# Patient Record
Sex: Male | Born: 1980 | Race: White | Hispanic: No | Smoking: Current every day smoker
Health system: Southern US, Community
[De-identification: ages and names within clinical notes are randomized; demographics above are authoritative.]

## PROBLEM LIST (undated history)

## (undated) DIAGNOSIS — E782 Mixed hyperlipidemia: Secondary | ICD-10-CM

## (undated) DIAGNOSIS — I10 Essential (primary) hypertension: Secondary | ICD-10-CM

## (undated) DIAGNOSIS — F419 Anxiety disorder, unspecified: Secondary | ICD-10-CM

---

## 2006-12-06 ENCOUNTER — Ambulatory Visit: Payer: Self-pay | Admitting: Obstetrics & Gynecology

## 2015-01-18 ENCOUNTER — Emergency Department: Payer: 59

## 2015-01-18 ENCOUNTER — Emergency Department
Admission: EM | Admit: 2015-01-18 | Discharge: 2015-01-18 | Disposition: A | Discharge status: Home / Self Care | Payer: 59 | Attending: Emergency Medicine | Admitting: Emergency Medicine

## 2015-01-18 ENCOUNTER — Encounter: Payer: Self-pay | Admitting: Emergency Medicine

## 2015-01-18 ENCOUNTER — Other Ambulatory Visit: Payer: Self-pay

## 2015-01-18 DIAGNOSIS — I10 Essential (primary) hypertension: Secondary | ICD-10-CM | POA: Diagnosis not present

## 2015-01-18 DIAGNOSIS — R079 Chest pain, unspecified: Secondary | ICD-10-CM | POA: Insufficient documentation

## 2015-01-18 DIAGNOSIS — Z720 Tobacco use: Secondary | ICD-10-CM | POA: Insufficient documentation

## 2015-01-18 DIAGNOSIS — R61 Generalized hyperhidrosis: Secondary | ICD-10-CM | POA: Diagnosis not present

## 2015-01-18 DIAGNOSIS — J069 Acute upper respiratory infection, unspecified: Secondary | ICD-10-CM | POA: Insufficient documentation

## 2015-01-18 DIAGNOSIS — B9789 Other viral agents as the cause of diseases classified elsewhere: Secondary | ICD-10-CM

## 2015-01-18 HISTORY — DX: Mixed hyperlipidemia: E78.2

## 2015-01-18 HISTORY — DX: Essential (primary) hypertension: I10

## 2015-01-18 LAB — TROPONIN I
Troponin I: <0.03 ng/mL (ref <0.031)
Troponin I: <0.03 ng/mL (ref <0.031)

## 2015-01-18 LAB — COMPREHENSIVE METABOLIC PANEL
ALT: 25 U/L (ref 17–63)
AST: 24 U/L (ref 15–41)
Albumin: 4.6 g/dL (ref 3.5–5.0)
Alkaline Phosphatase: 66 U/L (ref 38–126)
Anion gap: 8 (ref 5–15)
BUN: 14 mg/dL (ref 6–20)
CHLORIDE: 104 mmol/L (ref 101–111)
CO2: 27 mmol/L (ref 22–32)
CREATININE: 1.07 mg/dL (ref 0.61–1.24)
Calcium: 9.1 mg/dL (ref 8.9–10.3)
GFR calc non Af Amer: >60 mL/min (ref >60)
GLUCOSE: 103 mg/dL — AB (ref 65–99)
Potassium: 4.4 mmol/L (ref 3.5–5.1)
SODIUM: 139 mmol/L (ref 135–145)
Total Bilirubin: 0.8 mg/dL (ref 0.3–1.2)
Total Protein: 7.5 g/dL (ref 6.5–8.1)

## 2015-01-18 LAB — CBC
HCT: 44.6 % (ref 40.0–52.0)
Hemoglobin: 15.7 g/dL (ref 13.0–18.0)
MCH: 31.8 pg (ref 26.0–34.0)
MCHC: 35.1 g/dL (ref 32.0–36.0)
MCV: 90.4 fL (ref 80.0–100.0)
PLATELETS: 298 10*3/uL (ref 150–440)
RBC: 4.94 MIL/uL (ref 4.40–5.90)
RDW: 12.1 % (ref 11.5–14.5)
WBC: 7.7 10*3/uL (ref 3.8–10.6)

## 2015-01-18 NOTE — ED Provider Notes (Signed)
[facility] Emergency Department Provider Note  ____________________________________________  Time seen: Approximately 2:40 PM  I have reviewed the triage vital signs and the nursing notes.   HISTORY  Chief Complaint Chest Pain    HPI Serina Cowperndrew Greenwalt is a 34 y.o. male with a history of hyperlipidemia and ongoing tobacco abuse presenting with chest pain. Patient reports that he has had "common cold" with associated congestion, rhinorrhea, intermittently productive cough. No fever, chills, shortness of breath. Over the past 3 days he has had a persistent central chest "pressure" that does not come and go, that does not radiate, and has no other associated symptoms. This morning the patient was in the shower when he developed a more severe version of the chest pressure which then radiated to bilateral arms and back and neck, associated with diaphoresis. No shortness of breath or nausea or vomiting. He called EMS. At this time, the patient is symptom-free. Patient does have a history of anxiety but feels that this episode was different than his previous panic attacks.   Past Medical History  Diagnosis Date  . Hypertension   . Elevated cholesterol with high triglycerides     There are no active problems to display for this patient.  Family history; no family history of early CAD.  Social history; ongoing tobacco abuse, several weekly drinks, denies any illicit drugs including cocaine.  History reviewed. No pertinent past surgical history.  No current outpatient prescriptions on file.  Allergies Review of patient's allergies indicates no known allergies.  No family history on file.  Social History Social History  Substance Use Topics  . Smoking status: Current Every Day Smoker  . Smokeless tobacco: None  . Alcohol Use: No    Review of Systems Constitutional: No fever/chills. Positive lightheadedness. Negative syncope. Positive diaphoresis Eyes: No  visual changes. ENT: No sore throat. Positive congestion Cardiovascular: Positive chest pain, negative palpitations. Respiratory: Denies shortness of breath.  Positive nonproductive cough. Gastrointestinal: No abdominal pain.  No nausea, no vomiting.  No diarrhea.  No constipation. Genitourinary: Negative for dysuria. Musculoskeletal: Negative for back pain. Skin: Negative for rash. Neurological: Negative for headaches, focal weakness or numbness.  10-point ROS otherwise negative.  ____________________________________________   PHYSICAL EXAM:  VITAL SIGNS: ED Triage Vitals  Enc Vitals Group     BP 01/18/15 1307 148/84 mmHg     Pulse Rate 01/18/15 1307 60     Resp 01/18/15 1307 18     Temp 01/18/15 1307 98.2 F (36.8 C)     Temp Source 01/18/15 1307 Oral     SpO2 01/18/15 1307 99 %     Weight 01/18/15 1307 229 lb (103.874 kg)     Height 01/18/15 1307 5\' 9"  (1.753 m)     Head Cir --      Peak Flow --      Pain Score 01/18/15 1308 5     Pain Loc --      Pain Edu? --      Excl. in GC? --     Constitutional: Alert and oriented. Well appearing and in no acute distress. Answer question appropriately. Eyes: Conjunctivae are normal.  EOMI. Head: Atraumatic. Nose: No congestion/rhinnorhea. Mouth/Throat: Mucous membranes are moist.  Neck: No stridor.  Supple.  No JVD Cardiovascular: Normal rate, regular rhythm. No murmurs, rubs or gallops.  Respiratory: Normal respiratory effort.  No retractions. Lungs CTAB.  No wheezes, rales or ronchi. Gastrointestinal: Overweight. Soft and nontender. No distention. No peritoneal signs. Musculoskeletal: No LE  edema. No palpable cords or calf tenderness. No Homans sign. Neurologic:  Normal speech and language. No gross focal neurologic deficits are appreciated.  Skin:  Skin is warm, dry and intact. No rash noted. Psychiatric: Mood and affect are normal. Speech and behavior are normal.  Normal  judgement.  ____________________________________________   LABS (all labs ordered are listed, but only abnormal results are displayed)  Labs Reviewed  COMPREHENSIVE METABOLIC PANEL - Abnormal; Notable for the following:    Glucose, Bld 103 (*)    All other components within normal limits  CBC  TROPONIN I  TROPONIN I   ____________________________________________  EKG  ED ECG REPORT I, Rockne Menghini, the attending physician, personally viewed and interpreted this ECG.  EKG shows a normal sinus rhythm with no ischemic changes. Patient has normal axis and normal intervals.  ____________________________________________  RADIOLOGY  Dg Chest 2 View  01/18/2015  CLINICAL DATA:  Chest pain and tightness in center of chest and a cough for 1.5 weeks, shielded EXAM: CHEST  2 VIEW COMPARISON:  None. FINDINGS: The heart size and mediastinal contours are within normal limits. Both lungs are clear. The visualized skeletal structures are unremarkable. IMPRESSION: No active cardiopulmonary disease. Electronically Signed   By: Norva Pavlov M.D.   On: 01/18/2015 15:00    ____________________________________________   PROCEDURES  Procedure(s) performed: None  Critical Care performed: No ____________________________________________   INITIAL IMPRESSION / ASSESSMENT AND PLAN / ED COURSE  Pertinent labs & imaging results that were available during my care of the patient were reviewed by me and considered in my medical decision making (see chart for details).  34 y.o. male with a history of hyperlipidemia and ongoing tobacco abuse presenting with a week of cough and cold symptoms, 3 days of constant chest pressure, and an episode of sharp chest pain earlier today. His EKG is reassuring, his labs show a negative troponin, and his chest x-ray is clear and without pneumonia. I have repeated his troponins twice that we are more than 4 hours from the onset of symptoms. I talked with  Dr. Welton Flakes and scheduled an outpatient stress test for the patient. I discussed return precautions and follow-up instructions with the patient and his wife.  ____________________________________________  FINAL CLINICAL IMPRESSION(S) / ED DIAGNOSES  Final diagnoses:  Diaphoresis  Chest pain, unspecified chest pain type  Viral URI with cough      NEW MEDICATIONS STARTED DURING THIS VISIT:  There are no discharge medications for this patient.    Rockne Menghini, MD 01/18/15 2137

## 2015-01-18 NOTE — Discharge Instructions (Signed)
Today, your workup in the emergency department for chest pain was very reassuring. However, given your known cardiac risk factors, it is imperative to have a risk stratification study (stress test) with the cardiologist.   Please continue to treat your cough and cold symptoms with over-the-counter medications as you have been doing.   Please return to the emergency department if you develop chest pain, shortness of breath, palpitations, fainting, fever, or any other symptoms concerning to you.

## 2015-01-18 NOTE — ED Notes (Signed)
C/o midsternal chest pressure that has been intermittent for the last 3 days, states today while he was in the shower he had chest pressure that was worse than before with some difficulty breathing, continues to have some pressure at present

## 2018-02-23 ENCOUNTER — Emergency Department: Payer: Self-pay

## 2018-02-23 ENCOUNTER — Emergency Department
Admission: EM | Admit: 2018-02-23 | Discharge: 2018-02-23 | Disposition: A | Discharge status: Home / Self Care | Payer: Self-pay | Attending: Emergency Medicine | Admitting: Emergency Medicine

## 2018-02-23 ENCOUNTER — Other Ambulatory Visit: Payer: Self-pay

## 2018-02-23 DIAGNOSIS — S62346A Nondisplaced fracture of base of fifth metacarpal bone, right hand, initial encounter for closed fracture: Secondary | ICD-10-CM | POA: Insufficient documentation

## 2018-02-23 DIAGNOSIS — Z23 Encounter for immunization: Secondary | ICD-10-CM | POA: Insufficient documentation

## 2018-02-23 DIAGNOSIS — Y999 Unspecified external cause status: Secondary | ICD-10-CM | POA: Insufficient documentation

## 2018-02-23 DIAGNOSIS — I10 Essential (primary) hypertension: Secondary | ICD-10-CM | POA: Insufficient documentation

## 2018-02-23 DIAGNOSIS — F1010 Alcohol abuse, uncomplicated: Secondary | ICD-10-CM | POA: Insufficient documentation

## 2018-02-23 DIAGNOSIS — Y92008 Other place in unspecified non-institutional (private) residence as the place of occurrence of the external cause: Secondary | ICD-10-CM | POA: Insufficient documentation

## 2018-02-23 DIAGNOSIS — S62344A Nondisplaced fracture of base of fourth metacarpal bone, right hand, initial encounter for closed fracture: Secondary | ICD-10-CM | POA: Insufficient documentation

## 2018-02-23 DIAGNOSIS — F17200 Nicotine dependence, unspecified, uncomplicated: Secondary | ICD-10-CM | POA: Insufficient documentation

## 2018-02-23 DIAGNOSIS — Y9389 Activity, other specified: Secondary | ICD-10-CM | POA: Insufficient documentation

## 2018-02-23 DIAGNOSIS — W1789XA Other fall from one level to another, initial encounter: Secondary | ICD-10-CM | POA: Insufficient documentation

## 2018-02-23 DIAGNOSIS — T148XXA Other injury of unspecified body region, initial encounter: Secondary | ICD-10-CM

## 2018-02-23 MED ORDER — HYDROCODONE-ACETAMINOPHEN 5-325 MG PO TABS: 1.0000 | ORAL_TABLET | Freq: Four times a day (QID) | ORAL | 0 refills | Status: DC | PRN

## 2018-02-23 MED ORDER — KETOROLAC TROMETHAMINE 30 MG/ML IJ SOLN
30.0000 mg | Freq: Once | INTRAMUSCULAR | Status: AC
  Administered 2018-02-23: 30 mg via INTRAMUSCULAR
  Filled 2018-02-23: qty 1

## 2018-02-23 MED ORDER — OXYCODONE-ACETAMINOPHEN 5-325 MG PO TABS
1.0000 | ORAL_TABLET | Freq: Once | ORAL | Status: AC
  Administered 2018-02-23: 1 via ORAL
  Filled 2018-02-23: qty 1

## 2018-02-23 MED ORDER — TETANUS-DIPHTH-ACELL PERTUSSIS 5-2.5-18.5 LF-MCG/0.5 IM SUSP
0.5000 mL | Freq: Once | INTRAMUSCULAR | Status: AC
  Administered 2018-02-23: 0.5 mL via INTRAMUSCULAR
  Filled 2018-02-23: qty 0.5

## 2018-02-23 NOTE — ED Provider Notes (Signed)
[facility] Emergency Department Provider Note   ____________________________________________   None    (approximate)  I have reviewed the triage vital signs and the nursing notes.   HISTORY  Chief Complaint Fall and Hand Injury   HPI Ryan Oneal is a 37 y.o. male presents to the ED with complaint of falling off porch at approximately 1 AM.  Patient reports right hand pain and right forearm pain.  He denies any head injury or loss of consciousness.  Patient has been drinking on known amount of alcohol.  Currently he is very angry at his 4-hour wait in the ED.  Initially he was going to walk out without being seen.  He stayed when he was told that flex was going to open up and see him.  He rates his pain as a 5/10.   Past Medical History:  Diagnosis Date  . Elevated cholesterol with high triglycerides   . Hypertension     There are no active problems to display for this patient.   No past surgical history on file.  Prior to Admission medications   Medication Sig Start Date End Date Taking? Authorizing Provider  HYDROcodone-acetaminophen (NORCO/VICODIN) 5-325 MG tablet Take 1 tablet by mouth every 6 (six) hours as needed for moderate pain. 02/23/18   Tommi Rumps, PA-C    Allergies Patient has no known allergies.  No family history on file.  Social History Social History   Tobacco Use  . Smoking status: Current Every Day Smoker  Substance Use Topics  . Alcohol use: No  . Drug use: Not on file    Review of Systems Constitutional: No fever/chills Eyes: No visual changes. ENT: No trauma. Cardiovascular: Denies chest pain. Respiratory: Denies shortness of breath. Gastrointestinal: No abdominal pain.  No nausea, no vomiting. Musculoskeletal: Positive for right hand and forearm pain. Skin: Positive for superficial abrasion to hand. Neurological: Negative for headaches, focal weakness or  numbness. ___________________________________________   PHYSICAL EXAM:  VITAL SIGNS: ED Triage Vitals  Enc Vitals Group     BP 02/23/18 0253 (!) 153/83     Pulse Rate 02/23/18 0253 (!) 102     Resp 02/23/18 0253 18     Temp 02/23/18 0253 98.3 F (36.8 C)     Temp Source 02/23/18 0253 Oral     SpO2 02/23/18 0253 97 %     Weight 02/23/18 0254 215 lb (97.5 kg)     Height 02/23/18 0254 5\' 10"  (1.778 m)     Head Circumference --      Peak Flow --      Pain Score 02/23/18 0254 5     Pain Loc --      Pain Edu? --      Excl. in GC? --    Constitutional: Alert and oriented. Well appearing and in no acute distress. Eyes: Conjunctivae are normal. PERRL. EOMI. Head: Atraumatic. Nose: No trauma. Mouth/Throat: Mucous membranes are moist.  Oropharynx non-erythematous. Neck: No stridor.  Cardiovascular: Normal rate, regular rhythm. Grossly normal heart sounds.  Good peripheral circulation. Respiratory: Normal respiratory effort.  No retractions. Lungs CTAB. Musculoskeletal:  Neurologic:  Normal speech and language. No gross focal neurologic deficits are appreciated. No gait instability. Skin:  Skin is warm, dry.  Superficial 1 cm skin avulsion noted on the dorsal aspect of the right index finger.  No active bleeding is noted. Psychiatric: Mood and affect are normal. Speech and behavior are normal.  ____________________________________________   LABS (all labs ordered  are listed, but only abnormal results are displayed)  Labs Reviewed - No data to display RADIOLOGY  Official radiology report(s): Dg Forearm Right  Result Date: 02/23/2018 CLINICAL DATA:  Right forearm pain after fall. EXAM: RIGHT FOREARM - 2 VIEW COMPARISON:  None. FINDINGS: Fourth and fifth proximal metacarpal fractures are noted. The radius and ulna appear normal. No soft tissue abnormality is noted. IMPRESSION: Normal right radius and ulna. Electronically Signed   By: Lupita RaiderJames  Green Jr, M.D.   On: 02/23/2018 07:48    Dg Hand Complete Right  Result Date: 02/23/2018 CLINICAL DATA:  Right hand pain and swelling after injury, fall off porch this morning. EXAM: RIGHT HAND - COMPLETE 3+ VIEW COMPARISON:  None. FINDINGS: Comminuted displaced fractures at the base of the fourth and fifth metacarpals likely extend to the carpal metacarpal articulation. There is apex dorsal angulation. Associated dorsal soft tissue edema. The remainder the hand is intact. IMPRESSION: Comminuted angulated displaced fractures base of the fourth and fifth metacarpals likely extending to the carpal metacarpal joint. Electronically Signed   By: Narda RutherfordMelanie  Sanford M.D.   On: 02/23/2018 03:14    ____________________________________________   PROCEDURES  Procedure(s) performed:   .Splint Application Date/Time: 02/23/2018 3:51 PM Performed by: Jacqlyn Larsenhomas, Jacob M, NT Authorized by: Tommi RumpsSummers,  L, PA-C   Consent:    Consent obtained:  Verbal   Consent given by:  Patient   Risks discussed:  Pain and swelling Pre-procedure details:    Sensation:  Normal Procedure details:    Laterality:  Right   Location:  Hand   Hand:  R hand   Strapping: no     Splint type:  Ulnar gutter   Supplies:  Ortho-Glass and elastic bandage Post-procedure details:    Pain:  Improved   Sensation:  Normal   Patient tolerance of procedure:  Tolerated well, no immediate complications    Critical Care performed: No  ____________________________________________   INITIAL IMPRESSION / ASSESSMENT AND PLAN / ED COURSE  As part of my medical decision making, I reviewed the following data within the electronic MEDICAL RECORD NUMBER Notes from prior ED visits and [place] Controlled Substance Database  Patient presents to the ED with complaint of right hand pain since falling off the porch early this morning.  Patient has drank a unknown amount of alcohol and has been sitting in the ED greater than 4 hours.  He is somewhat agitated at the lengthy wait and has  threatened to leave multiple times.  Patient was taken to flex at 7 AM where he was seen.  Patient was given Percocet while in the ED.  He then began complaining of his forearm hurting and was x-rayed which was negative.  Patient does have a fracture of the fourth and fifth metacarpal bases.  He has moderate amount of swelling to the dorsal aspect of his right hand.  Patient was made aware along with mother who was present.  He was placed in a OCL gutter splint with instructions to ice and elevate for pain.  He was discharged with a prescription for hydrocodone-acetaminophen 1 every 6 hours as needed for pain.  He is to continue ice and elevation and to follow-up with Dr. Ernest PineHooten who is on-call for orthopedics.  Patient is aware that he cannot mix the narcotic with alcohol.  ____________________________________________   FINAL CLINICAL IMPRESSION(S) / ED DIAGNOSES  Final diagnoses:  Closed nondisplaced fracture of base of fourth metacarpal bone of right hand, initial encounter  Closed nondisplaced fracture of  base of fifth metacarpal bone of right hand, initial encounter  Abrasion of skin  Alcohol abuse     ED Discharge Orders         Ordered    HYDROcodone-acetaminophen (NORCO/VICODIN) 5-325 MG tablet  Every 6 hours PRN     02/23/18 0758           Note:  This document was prepared using Dragon voice recognition software and may include unintentional dictation errors.    Tommi Rumps, PA-C 02/23/18 1554    Nita Sickle, MD 02/23/18 [phone]

## 2018-02-23 NOTE — ED Notes (Signed)
Patient to stat desk in no acute distress asking about wait time. Patient given update on wait time. Patient verbalizes understanding.  

## 2018-02-23 NOTE — Discharge Instructions (Addendum)
Call Monday for an appointment with the orthopedist.  His contact information is listed on your discharge papers.  Ice and elevation as needed for swelling and to reduce pain.  Take pain medication only as directed.  Do not mix alcohol with this medication.  You may also take ibuprofen 3 tablets with food 3 times a day which will help with inflammation and also reduce pain.

## 2018-02-23 NOTE — ED Notes (Signed)
See triage note  States he tripped over a cat on the porch    [place]  Landed on right hand  Right hand swollen   Good pulses noted

## 2018-02-23 NOTE — ED Triage Notes (Signed)
Patient reports fell off porch.  Reports right hand pain.

## 2018-07-03 ENCOUNTER — Other Ambulatory Visit: Payer: Self-pay

## 2018-07-03 ENCOUNTER — Emergency Department
Admission: EM | Admit: 2018-07-03 | Discharge: 2018-07-03 | Disposition: A | Discharge status: Home / Self Care | Payer: Self-pay | Attending: Emergency Medicine | Admitting: Emergency Medicine

## 2018-07-03 DIAGNOSIS — R079 Chest pain, unspecified: Secondary | ICD-10-CM | POA: Insufficient documentation

## 2018-07-03 DIAGNOSIS — I10 Essential (primary) hypertension: Secondary | ICD-10-CM | POA: Insufficient documentation

## 2018-07-03 DIAGNOSIS — R0789 Other chest pain: Secondary | ICD-10-CM

## 2018-07-03 DIAGNOSIS — F17210 Nicotine dependence, cigarettes, uncomplicated: Secondary | ICD-10-CM | POA: Insufficient documentation

## 2018-07-03 DIAGNOSIS — F419 Anxiety disorder, unspecified: Secondary | ICD-10-CM | POA: Insufficient documentation

## 2018-07-03 HISTORY — DX: Anxiety disorder, unspecified: F41.9

## 2018-07-03 LAB — COMPREHENSIVE METABOLIC PANEL
ALT: 14 U/L (ref 0–44)
AST: 16 U/L (ref 15–41)
Albumin: 4.5 g/dL (ref 3.5–5.0)
Alkaline Phosphatase: 67 U/L (ref 38–126)
Anion gap: 12 (ref 5–15)
BUN: 11 mg/dL (ref 6–20)
CO2: 26 mmol/L (ref 22–32)
Calcium: 9 mg/dL (ref 8.9–10.3)
Chloride: 103 mmol/L (ref 98–111)
Creatinine, Ser: 0.89 mg/dL (ref 0.61–1.24)
GFR calc Af Amer: >60 mL/min (ref >60)
GFR calc non Af Amer: >60 mL/min (ref >60)
Glucose, Bld: 128 mg/dL — ABNORMAL HIGH (ref 70–99)
Potassium: 4 mmol/L (ref 3.5–5.1)
Sodium: 141 mmol/L (ref 135–145)
Total Bilirubin: 0.4 mg/dL (ref 0.3–1.2)
Total Protein: 7.5 g/dL (ref 6.5–8.1)

## 2018-07-03 LAB — CBC WITH DIFFERENTIAL/PLATELET
Abs Immature Granulocytes: 0.02 10*3/uL (ref 0.00–0.07)
Basophils Absolute: 0.1 10*3/uL (ref 0.0–0.1)
Basophils Relative: 1 %
Eosinophils Absolute: 0.2 10*3/uL (ref 0.0–0.5)
Eosinophils Relative: 3 %
HCT: 45.5 % (ref 39.0–52.0)
Hemoglobin: 16.2 g/dL (ref 13.0–17.0)
Immature Granulocytes: 0 %
Lymphocytes Relative: 31 %
Lymphs Abs: 2.1 10*3/uL (ref 0.7–4.0)
MCH: 32 pg (ref 26.0–34.0)
MCHC: 35.6 g/dL (ref 30.0–36.0)
MCV: 89.9 fL (ref 80.0–100.0)
Monocytes Absolute: 0.7 10*3/uL (ref 0.1–1.0)
Monocytes Relative: 10 %
Neutro Abs: 3.7 10*3/uL (ref 1.7–7.7)
Neutrophils Relative %: 55 %
Platelets: 307 10*3/uL (ref 150–400)
RBC: 5.06 MIL/uL (ref 4.22–5.81)
RDW: 11.8 % (ref 11.5–15.5)
WBC: 6.8 10*3/uL (ref 4.0–10.5)
nRBC: 0 % (ref 0.0–0.2)

## 2018-07-03 LAB — TROPONIN I: Troponin I: <0.03 ng/mL (ref <0.03)

## 2018-07-03 MED ORDER — SODIUM CHLORIDE 0.9 % IV SOLN
1000.0000 mL | Freq: Once | INTRAVENOUS | Status: AC
  Administered 2018-07-03: 16:00:00 1000 mL via INTRAVENOUS

## 2018-07-03 MED ORDER — LORAZEPAM 1 MG PO TABS: 1.0000 mg | ORAL_TABLET | Freq: Two times a day (BID) | ORAL | 0 refills | Status: AC | PRN

## 2018-07-03 MED ORDER — LORAZEPAM 1 MG PO TABS
1.0000 mg | ORAL_TABLET | Freq: Once | ORAL | Status: AC
  Administered 2018-07-03: 1 mg via ORAL
  Filled 2018-07-03: qty 1

## 2018-07-03 NOTE — ED Notes (Signed)
Pt states that he is having anxiety attack and request anxiety medication to relieve - he says that he is "hot and dizzy" - will make provider aware

## 2018-07-03 NOTE — ED Triage Notes (Signed)
Pt arrives via EMS after having some chest pain, diaphoresis, lightheadedness, and anxiety- pt denies chest pain at this time but still feels lightheaded- pt has a history of anxiety

## 2018-07-03 NOTE — ED Notes (Signed)
Pt reports hx of anxiety and has not had medications in "awhile" - he reports that he had chest pain that has resolved at this time - NAD - respirations even and unlabored - pt does appear anxious

## 2018-07-03 NOTE — ED Provider Notes (Signed)
[facility] Emergency Department Provider Note   ____________________________________________    I have reviewed the triage vital signs and the nursing notes.   HISTORY  Chief Complaint Chest Pain     HPI Ryan Oneal is a 38 y.o. male who presents with complaints of chest pain.  Patient reports that approximately 130 today he became very "clammy "and lightheaded.  Shortly after that he developed some mild chest pressure which resolved prior to arrival to the emergency department.  He denies shortness of breath.  No leg pain or swelling.  He notes that his good friend died yesterday of a heart attack and he is quite stressed regarding this.  He also notes that he smoked a lot of cigarettes and drink a lot of beer last night because of his friend's death.  No history of heart disease does have a history of high blood pressure and high cholesterol.  No fevers chills cough.   Past Medical History:  Diagnosis Date  . Anxiety   . Elevated cholesterol with high triglycerides   . Hypertension     There are no active problems to display for this patient.   History reviewed. No pertinent surgical history.  Prior to Admission medications   Medication Sig Start Date End Date Taking? Authorizing Provider  HYDROcodone-acetaminophen (NORCO/VICODIN) 5-325 MG tablet Take 1 tablet by mouth every 6 (six) hours as needed for moderate pain. 02/23/18   Tommi Rumps, PA-C  LORazepam (ATIVAN) 1 MG tablet Take 1 tablet (1 mg total) by mouth 2 (two) times daily as needed for anxiety. 07/03/18 07/03/19  Jene Every, MD     Allergies Patient has no known allergies.  History reviewed. No pertinent family history.  Social History Social History   Tobacco Use  . Smoking status: Current Every Day Smoker  . Smokeless tobacco: Current User    Types: Chew  Substance Use Topics  . Alcohol use: No  . Drug use: Not on file    Review of Systems  Constitutional:  No fever/chills Eyes: No visual changes.  ENT: No sore throat. Cardiovascular: As above Respiratory: Denies shortness of breath. Gastrointestinal: No abdominal pain.  No nausea, no vomiting.   Genitourinary: Negative for dysuria. Musculoskeletal: Negative for back pain. Skin: Negative for rash. Neurological: Negative for headaches   ____________________________________________   PHYSICAL EXAM:  VITAL SIGNS: ED Triage Vitals  Enc Vitals Group     BP --      Pulse Rate 07/03/18 1448 79     Resp --      Temp --      Temp src --      SpO2 --      Weight 07/03/18 1449 94.3 kg (208 lb)     Height 07/03/18 1449 1.753 m (5\' 9" )     Head Circumference --      Peak Flow --      Pain Score 07/03/18 1449 0     Pain Loc --      Pain Edu? --      Excl. in GC? --     Constitutional: Alert and oriented.  Eyes: Conjunctivae are normal.   Nose: No congestion/rhinnorhea. Mouth/Throat: Mucous membranes are moist.   Neck:  Painless ROM Cardiovascular: Normal rate, regular rhythm.   Good peripheral circulation. Respiratory: Normal respiratory effort.  No retractions. GI: No abdominal tenderness to palpation Musculoskeletal:   Warm and well perfused Neurologic:  Normal speech and language. No gross focal neurologic deficits  are appreciated.  Skin:  Skin is warm, dry and intact. No rash noted. Psychiatric: Mood and affect are normal. Speech and behavior are normal.  ____________________________________________   LABS (all labs ordered are listed, but only abnormal results are displayed)  Labs Reviewed  COMPREHENSIVE METABOLIC PANEL - Abnormal; Notable for the following components:      Result Value   Glucose, Bld 128 (*)    All other components within normal limits  TROPONIN I  CBC WITH DIFFERENTIAL/PLATELET   ____________________________________________  EKG  ED ECG REPORT I, Jene Everyobert Dennard Vezina, the attending physician, personally viewed and interpreted this ECG.  Date:  07/03/2018  Rhythm: normal sinus rhythm QRS Axis: normal Intervals: normal ST/T Wave abnormalities: normal Narrative Interpretation: no evidence of acute ischemia  ____________________________________________  RADIOLOGY  None ____________________________________________   PROCEDURES  Procedure(s) performed: No  Procedures   Critical Care performed:No ____________________________________________   INITIAL IMPRESSION / ASSESSMENT AND PLAN / ED COURSE  Pertinent labs & imaging results that were available during my care of the patient were reviewed by me and considered in my medical decision making (see chart for details).  Patient presents with brief episodes of chest pressure as described above.  Differential includes stress reaction, near syncope event, less likely ACS/pericarditis.  No cough or fevers.  EKG reassuring, will give IV fluids, small dose of p.o. Ativan and reevaluate.  Patient's labs unremarkable, he feels much better after treatment, appropriate for discharge with outpatient follow-up with cardiology, strict return precautions discussed patient agrees this plan  Serina Cowperndrew Terpening was evaluated in Emergency Department on 07/03/2018 for the symptoms described in the history of present illness. He was evaluated in the context of the global COVID-19 pandemic, which necessitated consideration that the patient might be at risk for infection with the SARS-CoV-2 virus that causes COVID-19. Institutional protocols and algorithms that pertain to the evaluation of patients at risk for COVID-19 are in a state of rapid change based on information released by regulatory bodies including the CDC and federal and state organizations. These policies and algorithms were followed during the patient's care in the ED.     ____________________________________________   FINAL CLINICAL IMPRESSION(S) / ED DIAGNOSES  Final diagnoses:  Atypical chest pain        Note:  This  document was prepared using Dragon voice recognition software and may include unintentional dictation errors.   Jene EveryKinner, Tyvon Eggenberger, MD 07/03/18 [phone]

## 2018-07-03 NOTE — ED Notes (Signed)
Pt refuses to leave monitoring equipment on stating that the BP cuff is "to tight"

## 2019-05-08 IMAGING — CR DG HAND COMPLETE 3+V*R*
1 series · 3 of 3 positions shown · non-contrast
Comparison: None.

CLINICAL DATA: Right hand pain and swelling after injury, fall off
porch this morning.

EXAM:
RIGHT HAND - COMPLETE 3+ VIEW

[Series 1: dg hand complete right · 0.14mm/px · 3 of 3 slices shown]
[im 1/3]
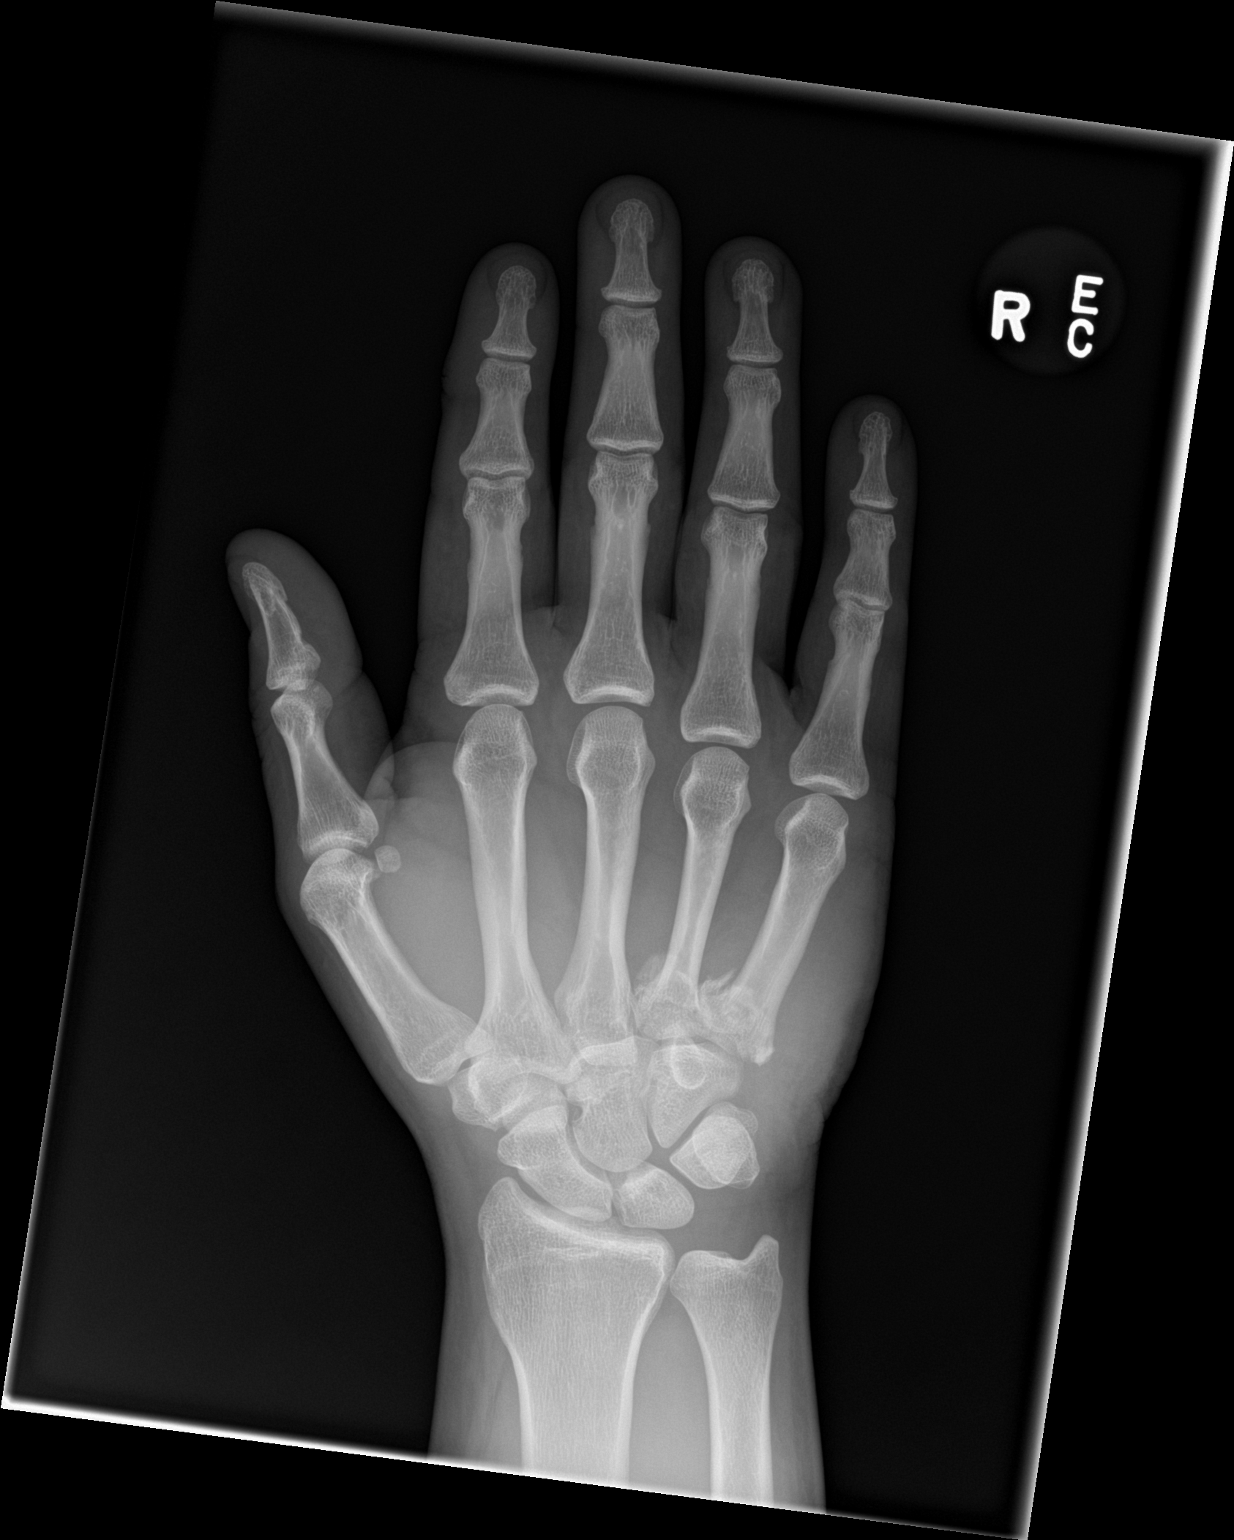
[im 2/3]
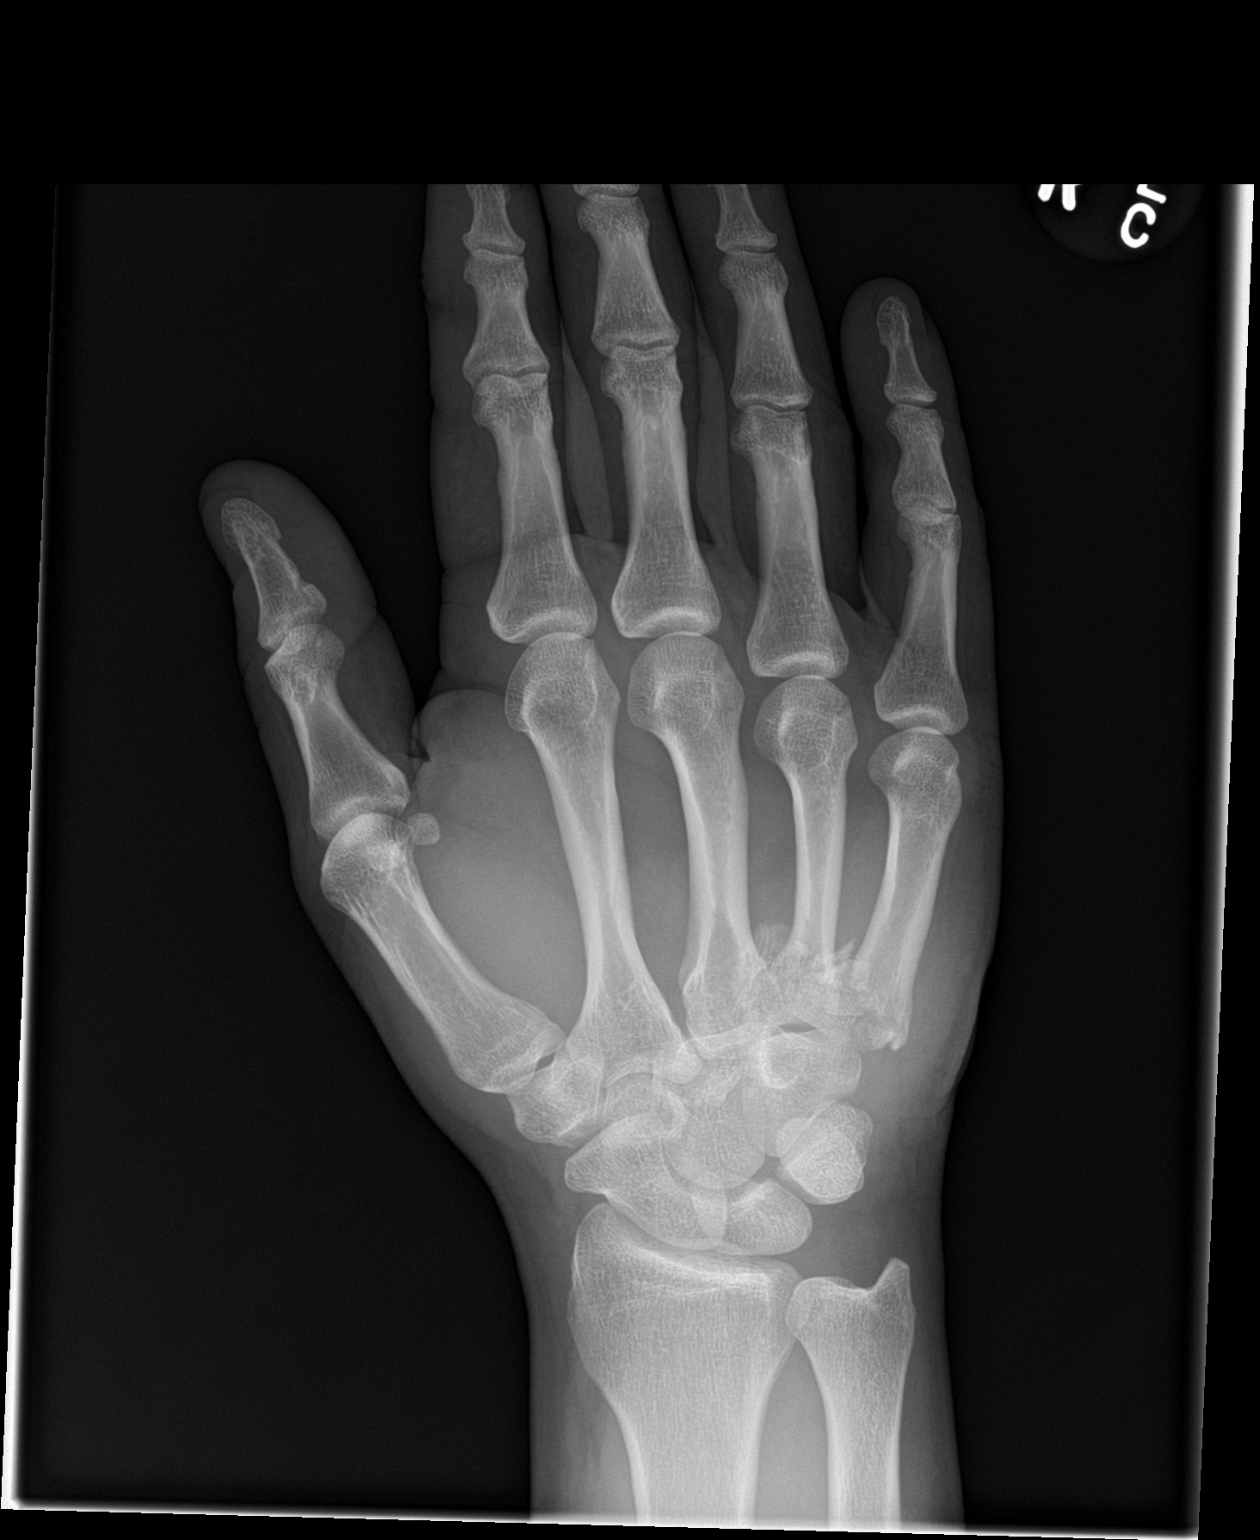
[im 3/3]
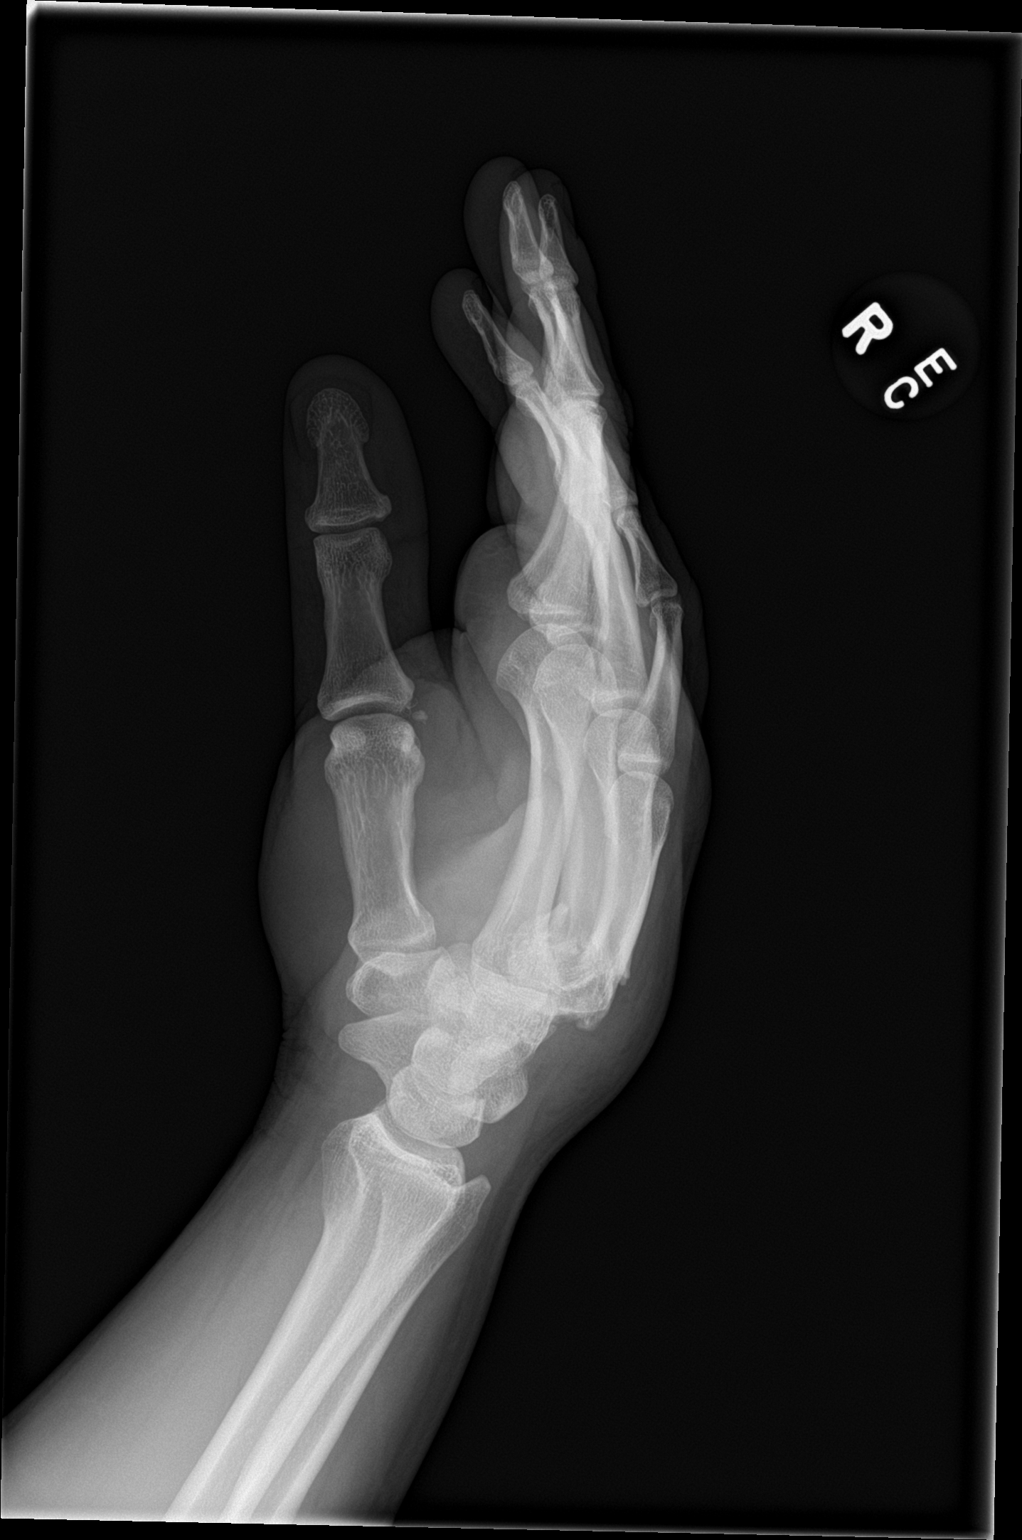

[3 of 3 positions shown; findings below may reference images not displayed]

FINDINGS: Comminuted displaced fractures at the base of the fourth and fifth
metacarpals likely extend to the carpal metacarpal articulation.
There is apex dorsal angulation. Associated dorsal soft tissue
edema. The remainder the hand is intact.
IMPRESSION: Comminuted angulated displaced fractures base of the fourth and
fifth metacarpals likely extending to the carpal metacarpal joint.

## 2019-07-13 ENCOUNTER — Ambulatory Visit (INDEPENDENT_AMBULATORY_CARE_PROVIDER_SITE_OTHER): Payer: 59 | Admitting: Urology

## 2019-07-13 ENCOUNTER — Encounter: Payer: Self-pay | Admitting: Urology

## 2019-07-13 ENCOUNTER — Other Ambulatory Visit: Payer: Self-pay

## 2019-07-13 VITALS — BP 134/84 | HR 84 | Ht 70.0 in | Wt 205.0 lb

## 2019-07-13 DIAGNOSIS — Z3009 Encounter for other general counseling and advice on contraception: Secondary | ICD-10-CM | POA: Diagnosis not present

## 2019-07-13 NOTE — Progress Notes (Signed)
07/13/2019 3:02 PM   Barbie Banner 06/24/1980 [id number]  Referring provider: Dion Body, MD [facility],  [place] [address]  Chief Complaint  Patient presents with  . VAS Consult    HPI: Ryan Oneal is a 39 y.o. male who presents for vasectomy counseling  -Divorced with 2 children -Does not desire additional children -Denies history epididymitis or chronic scrotal pain -No previous urologic history of urologic surgery   PMH: Past Medical History:  Diagnosis Date  . Anxiety   . Elevated cholesterol with high triglycerides   . Hypertension     Surgical History: History reviewed. No pertinent surgical history.  Home Medications:  Allergies as of 07/13/2019   No Known Allergies     Medication List       Accurate as of Jul 13, 2019  3:02 PM. If you have any questions, ask your nurse or doctor.        ARIPiprazole 5 MG tablet Commonly known as: ABILIFY TAKE 1/2 TABLET BY MOUTH DAILY FOR 5 DAYS AND THEN INCREASE TO 1 FULL TABLET ONCE DAILY   busPIRone 10 MG tablet Commonly known as: BUSPAR Take by mouth.   HYDROcodone-acetaminophen 5-325 MG tablet Commonly known as: NORCO/VICODIN Take 1 tablet by mouth every 6 (six) hours as needed for moderate pain.   sertraline 50 MG tablet Commonly known as: ZOLOFT Take 50 mg by mouth every morning.       Allergies: No Known Allergies  Family History: History reviewed. No pertinent family history.  Social History:  reports that he has been smoking. His smokeless tobacco use includes chew. He reports that he does not drink alcohol. No history on file for drug.   Physical Exam: BP 134/84   Pulse 84   Ht 5\' 10"  (1.778 m)   Wt 205 lb (93 kg)   BMI 29.41 kg/m   Constitutional:  Alert and oriented, No acute distress. HEENT: [place] AT, moist mucus membranes.  Trachea midline, no masses. Cardiovascular: No clubbing, cyanosis, or edema. Respiratory: Normal respiratory  effort, no increased work of breathing. GI: Abdomen is soft, nontender, nondistended, no abdominal masses GU: Phallus without lesions, testes descended bilaterally without masses or tenderness.  Cords thick bilaterally, vasa palpable Lymph: No cervical or inguinal lymphadenopathy. Skin: No rashes, bruises or suspicious lesions. Neurologic: Grossly intact, no focal deficits, moving all 4 extremities. Psychiatric: Normal mood and affect.   Assessment & Plan:    - Undesired fertility He wishes to schedule vasectomy. We had a long discussion about vasectomy. We specifically discussed the procedure, recovery and the risks, benefits and alternatives of vasectomy. I explained that the procedure entails removal of a segment of each vas deferens, each of which conducts sperm, and that the purpose of this procedure is to cause sterility (inability to produce children or cause pregnancy). Vasectomy is intended to be permanent and irreversible form of contraception. Options for fertility after vasectomy include vasectomy reversal, or sperm retrieval with in vitro fertilization. These options are not always successful, and they may be expensive. We discussed reversible forms of birth control such as condoms, IUD or diaphragms, as well as the option of freezing sperm in a sperm bank prior to the vasectomy procedure. We discussed the importance of avoiding strenuous exercise for four days after vasectomy, and the importance of refraining from any form of ejaculation for seven days after vasectomy. I explained that vasectomy does not produce immediate sterility so another form of contraceptive must be used  until sterility is assured by having semen checked for sperm. Thus, a post vasectomy semen analysis is necessary to confirm sterility. Rarely, vasectomy must be repeated. We discussed the approximately 1 in 2,000 risk of pregnancy after vasectomy for men who have post-vasectomy semen analysis showing absent sperm or  rare non-motile sperm. Typical side effects include a small amount of oozing blood, some discomfort and mild swelling in the area of incision.  Vasectomy does not affect sexual performance, function, please, sensation, interest, desire, satisfaction, penile erection, volume of semen or ejaculation. Other rare risks include allergy or adverse reaction to an anesthetic, testicular atrophy, hematoma, infection/abscess, prolonged tenderness of the vas deferens, pain, swelling, painful nodule or scar (called sperm granuloma) or epididymtis. We discussed chronic testicular pain syndrome. This has been reported to occur in as many as 1-2% of men and may be permanent. This can be treated with medication, small procedures or (rarely) surgery.  He indicated he would call back if a Valium Rx is desired   Riki Altes, MD  [facility] [address], [address] [place], [place] [address] [phone]

## 2019-07-13 NOTE — Patient Instructions (Signed)

## 2019-08-13 ENCOUNTER — Telehealth: Payer: Self-pay | Admitting: Urology

## 2019-08-13 NOTE — Telephone Encounter (Signed)
Patient would like [facility] for his [facility]ctomy he is having on the 17th please and thank you

## 2019-08-14 MED ORDER — DIAZEPAM 10 MG PO TABS: ORAL_TABLET | ORAL | 0 refills | Status: DC

## 2019-08-27 ENCOUNTER — Other Ambulatory Visit: Payer: Self-pay

## 2019-08-27 ENCOUNTER — Other Ambulatory Visit: Payer: Self-pay | Admitting: Urology

## 2019-08-27 ENCOUNTER — Encounter: Payer: Self-pay | Admitting: Urology

## 2019-08-27 ENCOUNTER — Ambulatory Visit: Payer: 59 | Admitting: Urology

## 2019-08-27 VITALS — BP 128/82 | HR 88 | Ht 72.0 in | Wt 200.0 lb

## 2019-08-27 DIAGNOSIS — Z302 Encounter for sterilization: Secondary | ICD-10-CM

## 2019-08-27 MED ORDER — DIAZEPAM 10 MG PO TABS: ORAL_TABLET | ORAL | 0 refills | Status: AC

## 2019-08-27 MED ORDER — HYDROCODONE-ACETAMINOPHEN 5-325 MG PO TABS: 1.0000 | ORAL_TABLET | Freq: Four times a day (QID) | ORAL | 0 refills | Status: AC | PRN

## 2019-08-27 NOTE — Progress Notes (Signed)
Vasectomy Procedure Note  Indications: The patient is a 39 y.o. male who presents today for elective sterilization.  He has been consented for the procedure.  He is aware of the risks and benefits.  He had no additional questions.  He agrees to proceed.  He denies any other significant change since his last visit.  Pre-operative Diagnosis: Elective sterilization  Post-operative Diagnosis: Elective sterilization  Premedication: Valium 10 mg po  Surgeon: Lorin Picket C. Ernestine Langworthy, M.D  Description: The patient was prepped and draped in the standard fashion.  The right vas deferens was identified and brought superiorly to the anterior scrotal skin.  The skin and vas was then anesthetized utilizing 8 ml 1% lidocaine.  A small stab incision was made and spread with the vas dissector.  The vas was grasped utilizing the vas clamp and elevated out of the incision.  The vas was dissected free from surrounding tissue and vessels and an ~1 cm segment was excised.  The vas lumens were cauterized utilizing electrocautery.  The distal segment was buried in the surrounding sheath with a 3-0 chromic suture.  No significant bleeding was observed.  The vas ends were then dropped back into the hemiscrotum.  The skin was closed with hemostatic pressure.  An identical procedure was performed on the contralateral side.  Clean dry gauze was applied to the incision sites.  The patient tolerated the procedure well.  Complications:None  Recommendations: 1.  No lifting greater than 10 pounds or strenuousactivity for 1 week. 2.  Scrotal support for 1 week. 3.  Shower only for 1 week; may shower in the morning 4.  May resume intercourse in one week if no significant discomfort.  Continue alternate contraception for 12 weeks.  5.  Call for significant pain, swelling, redness, drainage or fever greater than 100.5. 6.  Rx hydrocodone/APAP 5/325 1-2 every 6 hours as needed for pain. 7.  Follow-up semen analysis in 12  weeks.  Irineo Axon, MD

## 2019-08-31 ENCOUNTER — Encounter: Payer: Self-pay | Admitting: Urology

## 2019-11-26 ENCOUNTER — Other Ambulatory Visit: Payer: Self-pay | Admitting: Family Medicine

## 2019-11-26 DIAGNOSIS — Z9852 Vasectomy status: Secondary | ICD-10-CM

## 2019-11-27 ENCOUNTER — Encounter: Payer: Self-pay | Admitting: Urology

## 2019-11-27 ENCOUNTER — Other Ambulatory Visit: Payer: Self-pay

## 2022-04-05 DIAGNOSIS — I10 Essential (primary) hypertension: Secondary | ICD-10-CM | POA: Diagnosis not present

## 2022-04-05 DIAGNOSIS — R7309 Other abnormal glucose: Secondary | ICD-10-CM | POA: Diagnosis not present

## 2022-04-12 DIAGNOSIS — R7303 Prediabetes: Secondary | ICD-10-CM | POA: Diagnosis not present

## 2022-04-12 DIAGNOSIS — E782 Mixed hyperlipidemia: Secondary | ICD-10-CM | POA: Diagnosis not present

## 2022-04-12 DIAGNOSIS — R0681 Apnea, not elsewhere classified: Secondary | ICD-10-CM | POA: Diagnosis not present

## 2022-04-12 DIAGNOSIS — E6609 Other obesity due to excess calories: Secondary | ICD-10-CM | POA: Diagnosis not present

## 2022-04-12 DIAGNOSIS — F419 Anxiety disorder, unspecified: Secondary | ICD-10-CM | POA: Diagnosis not present

## 2022-04-12 DIAGNOSIS — Z0000 Encounter for general adult medical examination without abnormal findings: Secondary | ICD-10-CM | POA: Diagnosis not present

## 2022-04-12 DIAGNOSIS — F17200 Nicotine dependence, unspecified, uncomplicated: Secondary | ICD-10-CM | POA: Diagnosis not present

## 2022-04-12 DIAGNOSIS — F32A Depression, unspecified: Secondary | ICD-10-CM | POA: Diagnosis not present

## 2022-04-12 DIAGNOSIS — Z6832 Body mass index (BMI) 32.0-32.9, adult: Secondary | ICD-10-CM | POA: Diagnosis not present

## 2022-04-12 DIAGNOSIS — I10 Essential (primary) hypertension: Secondary | ICD-10-CM | POA: Diagnosis not present

## 2022-04-12 DIAGNOSIS — R0683 Snoring: Secondary | ICD-10-CM | POA: Diagnosis not present

## 2023-01-03 DIAGNOSIS — E781 Pure hyperglyceridemia: Secondary | ICD-10-CM | POA: Diagnosis not present

## 2023-01-03 DIAGNOSIS — R7301 Impaired fasting glucose: Secondary | ICD-10-CM | POA: Diagnosis not present

## 2023-02-12 DIAGNOSIS — I10 Essential (primary) hypertension: Secondary | ICD-10-CM | POA: Diagnosis not present

## 2023-02-26 DIAGNOSIS — G4733 Obstructive sleep apnea (adult) (pediatric): Secondary | ICD-10-CM | POA: Diagnosis not present
